# Patient Record
Sex: Female | Born: 1980 | Race: Black or African American | Hispanic: No | Marital: Married | Smoking: Never smoker
Health system: Southern US, Community
[De-identification: ages and names within clinical notes are randomized; demographics above are authoritative.]

## PROBLEM LIST (undated history)

## (undated) DIAGNOSIS — K621 Rectal polyp: Secondary | ICD-10-CM

## (undated) HISTORY — DX: Rectal polyp: K62.1

---

## 2005-07-08 ENCOUNTER — Emergency Department (HOSPITAL_COMMUNITY): Admission: EM | Admit: 2005-07-08 | Discharge: 2005-07-08 | Payer: Self-pay | Admitting: Emergency Medicine

## 2005-07-31 ENCOUNTER — Encounter: Admission: RE | Admit: 2005-07-31 | Discharge: 2005-07-31 | Payer: Self-pay | Admitting: Specialist

## 2007-06-15 ENCOUNTER — Emergency Department (HOSPITAL_COMMUNITY): Admission: EM | Admit: 2007-06-15 | Discharge: 2007-06-15 | Payer: Self-pay | Admitting: Emergency Medicine

## 2009-11-09 ENCOUNTER — Emergency Department (HOSPITAL_COMMUNITY): Admission: EM | Admit: 2009-11-09 | Discharge: 2009-11-09 | Payer: Self-pay | Admitting: Emergency Medicine

## 2012-03-16 ENCOUNTER — Ambulatory Visit (INDEPENDENT_AMBULATORY_CARE_PROVIDER_SITE_OTHER): Payer: Managed Care, Other (non HMO) | Admitting: Emergency Medicine

## 2012-03-16 VITALS — BP 97/59 | HR 63 | Temp 98.4°F | Resp 16 | Ht 65.25 in | Wt 108.0 lb

## 2012-03-16 DIAGNOSIS — K621 Rectal polyp: Secondary | ICD-10-CM

## 2012-03-16 DIAGNOSIS — K625 Hemorrhage of anus and rectum: Secondary | ICD-10-CM

## 2012-03-16 MED ORDER — HYDROCORTISONE ACETATE 25 MG RE SUPP: 25.0000 mg | Freq: Two times a day (BID) | RECTAL | Status: AC

## 2012-03-16 MED ORDER — LIDOCAINE HCL 2 % EX GEL: CUTANEOUS | Status: AC | PRN

## 2012-03-16 NOTE — Patient Instructions (Signed)
Use Tucks pads (over the counter)

## 2012-03-16 NOTE — Progress Notes (Signed)
  Subjective:    Patient ID: Wendy Shaffer, female    DOB: 07-Dec-1980, 31 y.o.   MRN: [id number]  HPI patient states for the last 2 months she has had a prolapsing hemorrhoid. She says this bleeds off and on. She denies ever having children. She's had no trauma to this area.    Review of Systems she is in good health with no medical problems     Objective:   Physical Exam exam is limited to the anal area. There is a 1 cm prolapsing either polyp or internal hemorrhoid which is present at the anus. The end of the lesion appears to be excoriated and irritated. Xylocaine jelly was applied.        Assessment & Plan:

## 2012-04-03 ENCOUNTER — Encounter (INDEPENDENT_AMBULATORY_CARE_PROVIDER_SITE_OTHER): Payer: Self-pay | Admitting: Surgery

## 2012-04-03 ENCOUNTER — Ambulatory Visit (INDEPENDENT_AMBULATORY_CARE_PROVIDER_SITE_OTHER): Payer: Managed Care, Other (non HMO) | Admitting: Surgery

## 2012-04-03 VITALS — BP 112/78 | HR 70 | Temp 96.8°F | Resp 14 | Ht 65.0 in | Wt 112.1 lb

## 2012-04-03 DIAGNOSIS — K645 Perianal venous thrombosis: Secondary | ICD-10-CM

## 2012-04-03 NOTE — Progress Notes (Signed)
Subjective:     Patient ID: Wendy Shaffer, female   DOB: [birth year], 31 y.o.   MRN: [id number]  HPI  Wendy Shaffer  August 02, 1981 [id number]  Patient Care Team: Iona Hansen as PCP - General (Nurse Practitioner) Collene Gobble, MD as Attending Physician (Family Medicine)  This patient is a 31 y.o.female who presents today for surgical evaluation at the request of Dr. Cleta Alberts.   Reason for visit: Prolapsing rectal mass. Question of inflamed hemorrhoid.  Patient is a pleasant healthy female originally from [place]. A few months ago she noticed after a bowel movement some material pushing out. It was initially reducible. However it seemed to be more out all the time. Painful and uncomfortable to sit. Some bleeding. She saw her primary care physician. She was started on a suppository. Initially it did not seem to get better. An appointment was set up with [place].  She notes that the swelling is finally starting to go down. The mass is back to only intermittently prolapsing. This week is better as well. She comes today with a friend. No fevers or chills. Energy level good. Normally has a bowel movement 2 times a day. No prior anorectal problems. No history of rectal bleeding or discharge.  Patient Active Problem List  Diagnoses  . Hemorrhoid, right posterior, internal, thrombosed    Past Medical History  Diagnosis Date  . Rectal polyp     History reviewed. No pertinent past surgical history.  History   Social History  . Marital Status: Single    Spouse Name: N/A    Number of Children: N/A  . Years of Education: N/A   Occupational History  . Not on file.   Social History Main Topics  . Smoking status: Never Smoker   . Smokeless tobacco: Never Used  . Alcohol Use: No  . Drug Use: No  . Sexually Active: Not on file   Other Topics Concern  . Not on file   Social History Narrative  . No narrative on file    History reviewed. No pertinent family history.  Current Outpatient  Prescriptions  Medication Sig Dispense Refill  . Flaxseed-Eve Prim-Bilberry (TEARS AGAIN HYDRATE PO) Take by mouth daily.      Marland Kitchen lidocaine (XYLOCAINE JELLY) 2 % jelly Apply topically as needed.  30 mL  3     No Known Allergies  BP 112/78  Pulse 70  Temp(Src) 96.8 F (36 C) (Temporal)  Resp 14  Ht 5\' 5"  (1.651 m)  Wt 112 lb 2 oz (50.86 kg)  BMI 18.66 kg/m2  LMP 03/11/2012  No results found.   Review of Systems  Constitutional: Negative for fever, chills, diaphoresis, appetite change and fatigue.  HENT: Negative for ear pain, sore throat, trouble swallowing, neck pain and ear discharge.   Eyes: Negative for photophobia, discharge and visual disturbance.  Respiratory: Negative for cough, choking, chest tightness and shortness of breath.   Cardiovascular: Negative for chest pain and palpitations.       Patient walks 60 minutes for about 2 miles without difficulty.  No exertional chest/neck/shoulder/arm pain.   Gastrointestinal: Negative for nausea, vomiting, abdominal pain, diarrhea, constipation, anal bleeding and rectal pain.  Genitourinary: Negative for dysuria, frequency and difficulty urinating.  Musculoskeletal: Negative for myalgias and gait problem.  Skin: Negative for color change, pallor and rash.  Neurological: Negative for dizziness, speech difficulty, weakness and numbness.  Hematological: Negative for adenopathy.  Psychiatric/Behavioral: Negative for confusion and agitation. The patient is not nervous/anxious.  Objective:   Physical Exam  Constitutional: She is oriented to person, place, and time. She appears well-developed and well-nourished. No distress.  HENT:  Head: Normocephalic.  Mouth/Throat: Oropharynx is clear and moist. No oropharyngeal exudate.  Eyes: Conjunctivae and EOM are normal. Pupils are equal, round, and reactive to light. No scleral icterus.  Neck: Normal range of motion. Neck supple. No tracheal deviation present.  Cardiovascular:  Normal rate, regular rhythm and intact distal pulses.   Pulmonary/Chest: Effort normal and breath sounds normal. No respiratory distress. She exhibits no tenderness.  Abdominal: Soft. She exhibits no distension and no mass. There is no tenderness. Hernia confirmed negative in the right inguinal area and confirmed negative in the left inguinal area.  Genitourinary: No vaginal discharge found.       Exam done with assistance of female Medical Assistant in the room.  Perianal skin clean with good hygiene.  No pruritis.  No external skin tags / hemorrhoids of significance.  No pilonidal disease.  No fissure.  No abscess/fistula.    Tolerates digital and anoscopic rectal exam.  Normal sphincter tone.  No rectal masses.    Enlarged R posterior hemorrhoid with clot.  INternal.  Does not prolapse w Valsalva.  Rest of hemorrhoidal piles WNL   Musculoskeletal: Normal range of motion. She exhibits no tenderness.  Lymphadenopathy:    She has no cervical adenopathy.       Right: No inguinal adenopathy present.       Left: No inguinal adenopathy present.  Neurological: She is alert and oriented to person, place, and time. No cranial nerve deficit. She exhibits normal muscle tone. Coordination normal.  Skin: Skin is warm and dry. No rash noted. She is not diaphoretic. No erythema.  Psychiatric: She has a normal mood and affect. Her behavior is normal. Judgment and thought content normal.       Assessment:     Right posterior inflamed hemorrhoid. Thrombosed. Resolving slowly.    Plan:     At this point, it seems to be gradually resolving. It did not prolapse from E. on exam today. I suspect he'll continue improve over the next month.  Another option is to consider a local block and unroof/excise the thrombosed hemorrhoid. However, because it's a few weeks out now and she's feeling better, I think it is reasonable to allow to follow the course as long as he continues to improve. She was leaning more  towards that. I noted that the bleeding does not go away, worsens, or if she has other issues; then, I would do unroofing of the thrombosed hemorrhoid. She and her friend seem reassured. They can followup when necessary.  The anatomy & physiology of the anorectal region was discussed.  The pathophysiology of hemorrhoids and differential diagnosis was discussed.  Natural history progression  was discussed.   I stressed the importance of a bowel regimen to have daily soft bowel movements to minimize progression of disease.   Goal of one BM / day ideal.  Use of wet wipes, warm baths, avoiding straining, etc were emphasized.  Educational handouts further explaining the pathology, treatment options, and bowel regimen were given as well.   The patient expressed understanding.

## 2012-04-03 NOTE — Patient Instructions (Signed)

## 2014-10-06 ENCOUNTER — Ambulatory Visit (INDEPENDENT_AMBULATORY_CARE_PROVIDER_SITE_OTHER): Payer: BC Managed Care – PPO | Admitting: Family Medicine

## 2014-10-06 VITALS — BP 118/60 | HR 93 | Temp 97.7°F | Resp 16 | Ht 65.5 in | Wt 112.8 lb

## 2014-10-06 DIAGNOSIS — Z3200 Encounter for pregnancy test, result unknown: Secondary | ICD-10-CM

## 2014-10-06 DIAGNOSIS — Z3201 Encounter for pregnancy test, result positive: Secondary | ICD-10-CM

## 2014-10-06 LAB — POCT URINE PREGNANCY: Preg Test, Ur: POSITIVE

## 2014-10-06 NOTE — Patient Instructions (Signed)
Congratulations on your pregnancy!   You are 9 weeks 2 days today Your estimated due date is 05/09/2015.   Take a pre- natal vitamin daily

## 2014-10-06 NOTE — Progress Notes (Signed)
[facility] [address], [place] [id number] [phone]  Date:  10/06/2014   Name:  Wendy OctaveHiwot Shaffer   DOB:  [birth year]   MRN:  [id number]  PCP:  Iona HansenJones, Penny L, NP    Chief Complaint: Possible Pregnancy   History of Present Illness:  Wendy Shaffer is a 33 y.o. very pleasant female patient who presents with the following:  She is here today for a pregnancy test.  She took a home test that was positive today.   LMP was 08/02/14.  This is her first pregnancy and she is quite pleased.   She is feeling well.  No vomiting, no pain or bleeding.    She does have an OBG already.  She needs her results faxed to social services today for some reason and has a fax number for me Advised to start taking PNV.  She does not drink alcohol or use drugs   Patient Active Problem List   Diagnosis Date Noted  . Hemorrhoid, right posterior, internal, thrombosed 04/03/2012    Past Medical History  Diagnosis Date  . Rectal polyp     History reviewed. No pertinent past surgical history.  History  Substance Use Topics  . Smoking status: Never Smoker   . Smokeless tobacco: Never Used  . Alcohol Use: No    History reviewed. No pertinent family history.  No Known Allergies  Medication list has been reviewed and updated.  Current Outpatient Prescriptions on File Prior to Visit  Medication Sig Dispense Refill  . Flaxseed-Eve Prim-Bilberry (TEARS AGAIN HYDRATE PO) Take by mouth daily.     No current facility-administered medications on file prior to visit.    Review of Systems:  As per HPI- otherwise negative.   Physical Examination: Filed Vitals:   10/06/14 1216  BP: 120/52  Pulse: 93  Temp: 97.7 F (36.5 C)  Resp: 16   Filed Vitals:   10/06/14 1216  Height: 5' 5.5" (1.664 m)  Weight: 112 lb 12.8 oz (51.166 kg)   Body mass index is 18.48 kg/(m^2). Ideal Body Weight: Weight in (lb) to have BMI = 25: 152.2  GEN: WDWN, NAD, Non-toxic, A & O x 3, looks well,  slim build HEENT: Atraumatic, Normocephalic. Neck supple. No masses, No LAD. Ears and Nose: No external deformity. CV: RRR, No M/G/R. No JVD. No thrill. No extra heart sounds. PULM: CTA B, no wheezes, crackles, rhonchi. No retractions. No resp. distress. No accessory muscle use. ABD: S, NT, ND. No rebound. No HSM. EXTR: No c/c/e NEURO Normal gait.  PSYCH: Normally interactive. Conversant. Not depressed or anxious appearing.  Calm demeanor.    Results for orders placed or performed in visit on 10/06/14  POCT urine pregnancy  Result Value Ref Range   Preg Test, Ur Positive      Assessment and Plan: Possible pregnancy - Plan: POCT urine pregnancy pregnancy test positive today, she is 9+2 by LMP.  She has no questions and has plans to start pre- natal care.  She will avoid any activities where she could be injured, take PNV, avoid alcohol, tobacco and drugs   Signed Abbe AmsterdamJessica Catalea Labrecque, MD She needs her HCG faxed to social services at [phone]

## 2014-11-13 NOTE — L&D Delivery Note (Signed)
Delivery Note Patient pushed for one hour after she was noted to be C/C/+1.  At 12:15 PM a viable and healthy female was delivered via Vaginal, Spontaneous Delivery (Presentation: Right Occiput ).  APGAR: 9, 9; weight 8 lb 12.2 oz (3975 g).   Placenta status: Intact, Spontaneous.  Cord: 3 vessels with the following complications: None.    Anesthesia: Pudendal  Episiotomy: None Lacerations: 2nd degree;Perineal Suture Repair: 2.0 3.0 chromic vicryl Est. Blood Loss (mL): 365  Mom to postpartum.  Baby to Couplet care / Skin to Skin.  Essie Hart STACIA 05/06/2015, 5:58 PM

## 2014-12-31 LAB — OB RESULTS CONSOLE ANTIBODY SCREEN: Antibody Screen: NEGATIVE

## 2014-12-31 LAB — OB RESULTS CONSOLE HIV ANTIBODY (ROUTINE TESTING): HIV: NONREACTIVE

## 2014-12-31 LAB — OB RESULTS CONSOLE RPR: RPR: NONREACTIVE

## 2014-12-31 LAB — OB RESULTS CONSOLE RUBELLA ANTIBODY, IGM: Rubella: IMMUNE

## 2014-12-31 LAB — OB RESULTS CONSOLE ABO/RH: RH Type: POSITIVE

## 2014-12-31 LAB — OB RESULTS CONSOLE GC/CHLAMYDIA
Chlamydia: NEGATIVE
Gonorrhea: NEGATIVE

## 2014-12-31 LAB — OB RESULTS CONSOLE HEPATITIS B SURFACE ANTIGEN: HEP B S AG: NEGATIVE

## 2015-04-09 LAB — OB RESULTS CONSOLE GBS: GBS: NEGATIVE

## 2015-05-06 ENCOUNTER — Encounter (HOSPITAL_COMMUNITY): Payer: Self-pay

## 2015-05-06 ENCOUNTER — Inpatient Hospital Stay (HOSPITAL_COMMUNITY)
Admission: AD | Admit: 2015-05-06 | Discharge: 2015-05-08 | DRG: 775 | Disposition: A | Discharge status: Home / Self Care | Payer: Medicaid Other | Source: Ambulatory Visit | Attending: Obstetrics & Gynecology | Admitting: Obstetrics & Gynecology

## 2015-05-06 DIAGNOSIS — Z3A39 39 weeks gestation of pregnancy: Secondary | ICD-10-CM | POA: Diagnosis present

## 2015-05-06 DIAGNOSIS — D649 Anemia, unspecified: Secondary | ICD-10-CM | POA: Diagnosis not present

## 2015-05-06 DIAGNOSIS — O9989 Other specified diseases and conditions complicating pregnancy, childbirth and the puerperium: Secondary | ICD-10-CM | POA: Diagnosis present

## 2015-05-06 DIAGNOSIS — O9081 Anemia of the puerperium: Secondary | ICD-10-CM | POA: Diagnosis not present

## 2015-05-06 LAB — CBC
HEMATOCRIT: 33.5 % — AB (ref 36.0–46.0)
Hemoglobin: 11.7 g/dL — ABNORMAL LOW (ref 12.0–15.0)
MCH: 33.1 pg (ref 26.0–34.0)
MCHC: 34.9 g/dL (ref 30.0–36.0)
MCV: 94.9 fL (ref 78.0–100.0)
PLATELETS: 237 10*3/uL (ref 150–400)
RBC: 3.53 MIL/uL — AB (ref 3.87–5.11)
RDW: 13.3 % (ref 11.5–15.5)
WBC: 12.2 10*3/uL — AB (ref 4.0–10.5)

## 2015-05-06 LAB — ABO/RH: ABO/RH(D): O POS

## 2015-05-06 LAB — TYPE AND SCREEN
ABO/RH(D): O POS
Antibody Screen: NEGATIVE

## 2015-05-06 MED ORDER — ACETAMINOPHEN 325 MG PO TABS: 650.0000 mg | ORAL_TABLET | ORAL | Status: DC | PRN

## 2015-05-06 MED ORDER — LIDOCAINE HCL (PF) 1 % IJ SOLN
30.0000 mL | INTRAMUSCULAR | Status: AC | PRN
  Administered 2015-05-06 (×2): 30 mL via SUBCUTANEOUS
  Filled 2015-05-06 (×2): qty 30

## 2015-05-06 MED ORDER — FLEET ENEMA 7-19 GM/118ML RE ENEM: 1.0000 | ENEMA | RECTAL | Status: DC | PRN

## 2015-05-06 MED ORDER — DIBUCAINE 1 % RE OINT: 1.0000 "application " | TOPICAL_OINTMENT | RECTAL | Status: DC | PRN

## 2015-05-06 MED ORDER — OXYTOCIN 40 UNITS IN LACTATED RINGERS INFUSION - SIMPLE MED: 62.5000 mL/h | INTRAVENOUS | Status: DC | PRN

## 2015-05-06 MED ORDER — DIPHENHYDRAMINE HCL 25 MG PO CAPS: 25.0000 mg | ORAL_CAPSULE | Freq: Four times a day (QID) | ORAL | Status: DC | PRN

## 2015-05-06 MED ORDER — LACTATED RINGERS IV SOLN: 500.0000 mL | INTRAVENOUS | Status: DC | PRN

## 2015-05-06 MED ORDER — WITCH HAZEL-GLYCERIN EX PADS: 1.0000 "application " | MEDICATED_PAD | CUTANEOUS | Status: DC | PRN

## 2015-05-06 MED ORDER — SENNOSIDES-DOCUSATE SODIUM 8.6-50 MG PO TABS
2.0000 | ORAL_TABLET | ORAL | Status: DC
  Administered 2015-05-07 – 2015-05-08 (×2): 2 via ORAL
  Filled 2015-05-06 (×2): qty 2

## 2015-05-06 MED ORDER — TETANUS-DIPHTH-ACELL PERTUSSIS 5-2.5-18.5 LF-MCG/0.5 IM SUSP: 0.5000 mL | Freq: Once | INTRAMUSCULAR | Status: DC

## 2015-05-06 MED ORDER — ACETAMINOPHEN 325 MG PO TABS
650.0000 mg | ORAL_TABLET | ORAL | Status: DC | PRN
  Administered 2015-05-07 – 2015-05-08 (×2): 650 mg via ORAL
  Filled 2015-05-06 (×2): qty 2

## 2015-05-06 MED ORDER — OXYTOCIN 40 UNITS IN LACTATED RINGERS INFUSION - SIMPLE MED
62.5000 mL/h | INTRAVENOUS | Status: DC
  Filled 2015-05-06: qty 1000

## 2015-05-06 MED ORDER — OXYTOCIN 40 UNITS IN LACTATED RINGERS INFUSION - SIMPLE MED
1.0000 m[IU]/min | INTRAVENOUS | Status: DC
Start: 2015-05-06 — End: 2015-05-07
  Administered 2015-05-06: 2 m[IU]/min via INTRAVENOUS
  Filled 2015-05-06: qty 1000

## 2015-05-06 MED ORDER — IBUPROFEN 600 MG PO TABS
600.0000 mg | ORAL_TABLET | Freq: Four times a day (QID) | ORAL | Status: DC
  Administered 2015-05-06 – 2015-05-08 (×8): 600 mg via ORAL
  Filled 2015-05-06 (×7): qty 1

## 2015-05-06 MED ORDER — OXYTOCIN BOLUS FROM INFUSION: 500.0000 mL | INTRAVENOUS | Status: DC

## 2015-05-06 MED ORDER — ONDANSETRON HCL 4 MG/2ML IJ SOLN: 4.0000 mg | INTRAMUSCULAR | Status: DC | PRN

## 2015-05-06 MED ORDER — OXYCODONE-ACETAMINOPHEN 5-325 MG PO TABS: 2.0000 | ORAL_TABLET | ORAL | Status: DC | PRN

## 2015-05-06 MED ORDER — PRENATAL MULTIVITAMIN CH
1.0000 | ORAL_TABLET | Freq: Every day | ORAL | Status: DC
  Administered 2015-05-06 – 2015-05-08 (×3): 1 via ORAL
  Filled 2015-05-06 (×3): qty 1

## 2015-05-06 MED ORDER — LANOLIN HYDROUS EX OINT: TOPICAL_OINTMENT | CUTANEOUS | Status: DC | PRN

## 2015-05-06 MED ORDER — ONDANSETRON HCL 4 MG/2ML IJ SOLN: 4.0000 mg | Freq: Four times a day (QID) | INTRAMUSCULAR | Status: DC | PRN

## 2015-05-06 MED ORDER — TERBUTALINE SULFATE 1 MG/ML IJ SOLN
0.2500 mg | Freq: Once | INTRAMUSCULAR | Status: AC | PRN
  Filled 2015-05-06: qty 1

## 2015-05-06 MED ORDER — SIMETHICONE 80 MG PO CHEW: 80.0000 mg | CHEWABLE_TABLET | ORAL | Status: DC | PRN

## 2015-05-06 MED ORDER — OXYCODONE-ACETAMINOPHEN 5-325 MG PO TABS: 1.0000 | ORAL_TABLET | ORAL | Status: DC | PRN

## 2015-05-06 MED ORDER — ONDANSETRON HCL 4 MG PO TABS: 4.0000 mg | ORAL_TABLET | ORAL | Status: DC | PRN

## 2015-05-06 MED ORDER — BACITRACIN ZINC 500 UNIT/GM EX OINT
TOPICAL_OINTMENT | CUTANEOUS | Status: DC | PRN
  Filled 2015-05-06: qty 28.35

## 2015-05-06 MED ORDER — CITRIC ACID-SODIUM CITRATE 334-500 MG/5ML PO SOLN: 30.0000 mL | ORAL | Status: DC | PRN

## 2015-05-06 MED ORDER — ZOLPIDEM TARTRATE 5 MG PO TABS: 5.0000 mg | ORAL_TABLET | Freq: Every evening | ORAL | Status: DC | PRN

## 2015-05-06 MED ORDER — BUTORPHANOL TARTRATE 1 MG/ML IJ SOLN
INTRAMUSCULAR | Status: AC
  Administered 2015-05-06: 1 mg via INTRAVENOUS
  Filled 2015-05-06: qty 1

## 2015-05-06 MED ORDER — LACTATED RINGERS IV SOLN
INTRAVENOUS | Status: DC
  Administered 2015-05-06: 07:00:00 via INTRAVENOUS

## 2015-05-06 MED ORDER — BUTORPHANOL TARTRATE 1 MG/ML IJ SOLN
1.0000 mg | INTRAMUSCULAR | Status: DC | PRN
  Administered 2015-05-06: 1 mg via INTRAVENOUS

## 2015-05-06 MED ORDER — BENZOCAINE-MENTHOL 20-0.5 % EX AERO
1.0000 "application " | INHALATION_SPRAY | CUTANEOUS | Status: DC | PRN
  Administered 2015-05-06: 1 via TOPICAL
  Filled 2015-05-06: qty 56

## 2015-05-06 NOTE — H&P (Signed)
Wendy Shaffer is a 34 y.o. female presenting for rupture of membranes, clear at 05:45, irregular ctx, no vaginal bleeding.  Maternal Medical History:  Reason for admission: Rupture of membranes.  Nausea.  Contractions: Onset was 3-5 hours ago.   Frequency: irregular.   Duration is approximately 60 seconds.   Perceived severity is mild.    Fetal activity: Perceived fetal activity is normal.   Last perceived fetal movement was within the past 12 hours.    Prenatal complications: no prenatal complications Prenatal Complications - Diabetes: none.    OB History    Gravida Para Term Preterm AB TAB SAB Ectopic Multiple Living   1              Past Medical History  Diagnosis Date  . Rectal polyp    History reviewed. No pertinent past surgical history. Family History: family history is not on file. Social History:  reports that she has never smoked. She has never used smokeless tobacco. She reports that she does not drink alcohol or use illicit drugs.   Prenatal Transfer Tool  Maternal Diabetes: No Genetic Screening: Declined Maternal Ultrasounds/Referrals: Normal Fetal Ultrasounds or other Referrals:  Other: anatomy scan normal Maternal Substance Abuse:  No Significant Maternal Medications:  None Significant Maternal Lab Results:  Lab values include: Group B Strep negative Other Comments:  None  Review of Systems  Constitutional: Negative for fever and chills.  HENT: Negative for hearing loss.   Eyes: Negative for blurred vision and double vision.  Respiratory: Negative for cough and hemoptysis.   Cardiovascular: Negative for chest pain and palpitations.  Gastrointestinal: Negative for heartburn and nausea.  Genitourinary: Negative for dysuria and urgency.  Musculoskeletal: Negative for myalgias and neck pain.  Skin: Negative for rash.  Neurological: Negative for dizziness and headaches.  Endo/Heme/Allergies: Does not bruise/bleed easily.  Psychiatric/Behavioral: Negative  for depression and suicidal ideas.  All other systems reviewed and are negative.   Dilation: 3.5 Effacement (%): 90 Station: -1 Exam by:: Sowder,RNC Blood pressure 110/71, pulse 82, temperature 98.7 F (37.1 C), temperature source Oral, resp. rate 20, height 5\' 5"  (1.651 m), weight 72.122 kg (159 lb), last menstrual period 08/02/2014. Maternal Exam:  Uterine Assessment: Contraction strength is mild.  Contraction duration is 60 seconds. Contraction frequency is regular.   Abdomen: Patient reports no abdominal tenderness. Fundal height is 39 cm.   Estimated fetal weight is 3000 grams.   Fetal presentation: vertex  Introitus: Normal vulva. Normal vagina.  Ferning test: positive.  Nitrazine test: positive. Amniotic fluid character: clear.  Pelvis: adequate for delivery.   Cervix: Cervix evaluated by digital exam.   3/80/-2  Fetal Exam Fetal Monitor Review: Baseline rate: 135.  Variability: moderate (6-25 bpm).   Pattern: accelerations present and no decelerations.    Fetal State Assessment: Category I - tracings are normal.     Physical Exam  Nursing note and vitals reviewed. Constitutional: She is oriented to person, place, and time. She appears well-developed and well-nourished.  HENT:  Head: Normocephalic and atraumatic.  Eyes: Pupils are equal, round, and reactive to light.  Neck: Normal range of motion.  Cardiovascular: Normal rate and normal heart sounds.   GI: Soft. Bowel sounds are normal.  Musculoskeletal: Normal range of motion.  Neurological: She is alert and oriented to person, place, and time. She has normal reflexes.    Prenatal labs: ABO, Rh: --/--/O POS (06/23 0654) Antibody: NEG (06/23 0654) Rubella: Immune (02/18 0000) RPR: Nonreactive (02/18 0000)  HBsAg: Negative (  02/18 0000)  HIV: Non-reactive (02/18 0000)  GBS: Negative (05/27 0000)   Assessment/Plan: 34 yo G1P0 at 39 weeks 4 days s/p SROM in early labor Admit to L&D Continuous  monitoring Epidural on demand Pitocin if needed for augmentation    Abisai Deer STACIA 05/06/2015, 9:57 AM

## 2015-05-06 NOTE — MAU Note (Signed)
Pt states that she SROM at 0545 for clear fluid. Pt denies bleeding and states that baby is acive. Pt is not having contractions that are strong.

## 2015-05-06 NOTE — Progress Notes (Signed)
Pts temp has been rising since admission and is now 100.4 with pulse 101 and BP 120/74 at 1820.  Pts fundus is high at 2/U.  Got pt up to bathroom and emptied bladder.No dizziness or light headedness noted.  Pt did complain of some pain and pressure in the perineum.  Passed a ping pong sized clot.  Got pt back to bed and fundus is firm midline but still to pts right.  As massaging fundus looks like more clots trying to come out but nothing does.  Dr. Mora Appl called at 1840 and was reported all these findings.  No orders received and told to continue to monitor.

## 2015-05-07 LAB — CBC
HCT: 25.8 % — ABNORMAL LOW (ref 36.0–46.0)
HEMOGLOBIN: 9 g/dL — AB (ref 12.0–15.0)
MCH: 33.1 pg (ref 26.0–34.0)
MCHC: 34.9 g/dL (ref 30.0–36.0)
MCV: 94.9 fL (ref 78.0–100.0)
Platelets: 180 10*3/uL (ref 150–400)
RBC: 2.72 MIL/uL — AB (ref 3.87–5.11)
RDW: 13.3 % (ref 11.5–15.5)
WBC: 15.2 10*3/uL — AB (ref 4.0–10.5)

## 2015-05-07 LAB — RPR: RPR Ser Ql: NONREACTIVE

## 2015-05-07 MED ORDER — FERROUS SULFATE 325 (65 FE) MG PO TABS
325.0000 mg | ORAL_TABLET | Freq: Every day | ORAL | Status: DC
  Administered 2015-05-08: 325 mg via ORAL
  Filled 2015-05-07: qty 1

## 2015-05-07 NOTE — Progress Notes (Signed)
Patient is eating, ambulating, voiding.  Pain control is good.  Appropriate lochia.  No complaints.  Filed Vitals:   05/06/15 1820 05/06/15 1957 05/07/15 0543 05/07/15 1713  BP: 120/74 104/65 109/57 105/62  Pulse: 101 86 76 100  Temp: 100.4 F (38 C) 99.1 F (37.3 C) 98.8 F (37.1 C) 98.3 F (36.8 C)  TempSrc: Oral Oral Oral Oral  Resp: 20 20 19 18   Height:      Weight:      SpO2:   100%     Fundus firm Perineum without swelling.  Lab Results  Component Value Date   WBC 15.2* 05/07/2015   HGB 9.0* 05/07/2015   HCT 25.8* 05/07/2015   MCV 94.9 05/07/2015   PLT 180 05/07/2015    --/--/O POS (06/23 0700)  A/P Post partum day 1. Anemia; FESO4 qd  Routine care.  Expect d/c 6/25.    Philip Aspen

## 2015-05-07 NOTE — Lactation Note (Signed)
This note was copied from the chart of Wendy Natalin Steidle. Lactation Consultation Note New mom BF in cradle position, w/not a deep latch. Baby not suckling on breast, just licking. Assisted in football position for a deeper latch.  Mom has tubular breast w/2 fingers width space between breast. Breast feel knotty. Large areola and large everted nipple w/short shaft. Encouraged to stimulate to evert more.  Mom speaks good [place] for communication.  Assisted in football position for deeper latch. Took baby a few minutes to start suckling. Beautiful baby Wendy BF well at breast. A lot of teaching to mom on positioning, encouraged not to pull back on breast while BF w/unlatch baby. Referred to Baby and Me Book in Breastfeeding section Pg. 22-23 for position options and Proper latch demonstration. Mom encouraged to feed baby 8-12 times/24 hours and with feeding cues. Mom encouraged to waken baby for feeds. Educated about newborn behavior. Mom encouraged to do skin-to-skin. WH/LC brochure given w/resources, support groups and LC services. Encouraged comfort during BF so colostrum flows better and mom will enjoy the feeding longer. Taking deep breaths and breast massage during BF. Hand expression taught w/glistening of colostrum.  Patient Name: Wendy Shaffer [id number] Date: 05/07/2015 Reason for consult: Initial assessment   Maternal Data    Feeding Feeding Type: Breast Fed Length of feed: 10 min (still BF)  LATCH Score/Interventions Latch: Repeated attempts needed to sustain latch, nipple held in mouth throughout feeding, stimulation needed to elicit sucking reflex. Intervention(s): Adjust position;Assist with latch;Breast massage;Breast compression  Audible Swallowing: A few with stimulation Intervention(s): Skin to skin;Hand expression;Alternate breast massage  Type of Nipple: Everted at rest and after stimulation  Comfort (Breast/Nipple): Soft / non-tender     Hold (Positioning): Assistance  needed to correctly position infant at breast and maintain latch. Intervention(s): Skin to skin;Position options;Support Pillows;Breastfeeding basics reviewed  LATCH Score: 7  Lactation Tools Discussed/Used     Consult Status Consult Status: Follow-up Date: 05/07/15 (in pm) Follow-up type: In-patient    Arnell Mausolf, Diamond Nickel 05/07/2015, 1:38 AM

## 2015-05-07 NOTE — Progress Notes (Signed)
UR chart review completed.  

## 2015-05-08 NOTE — Discharge Summary (Signed)
Obstetric Discharge Summary Reason for Admission: rupture of membranes Prenatal Procedures: ultrasound Intrapartum Procedures: spontaneous vaginal delivery Postpartum Procedures: none Complications-Operative and Postpartum: 2nd degree perineal laceration HEMOGLOBIN  Date Value Ref Range Status  05/07/2015 9.0* 12.0 - 15.0 g/dL Final    Comment:    DELTA CHECK NOTED REPEATED TO VERIFY    HCT  Date Value Ref Range Status  05/07/2015 25.8* 36.0 - 46.0 % Final    Physical Exam:  General: alert and cooperative Lochia: appropriate Uterine Fundus: firm DVT Evaluation: No evidence of DVT seen on physical exam.  Discharge Diagnoses: Term Pregnancy-delivered  Discharge Information: Date: 05/08/2015 Activity: pelvic rest Diet: routine Medications: PNV and Ibuprofen Condition: stable Instructions: refer to practice specific booklet Discharge to: home Follow-up Information    Follow up with [name], [name], MD In 4 weeks.   Specialty:  Obstetrics and Gynecology   Contact information:   [address] [place] [address] [phone]       Newborn Data: Live born female  Birth Weight: 8 lb 12.2 oz (3975 g) APGAR: 9, 9  Home with mother.  Wendy Shaffer 05/08/2015, 4:05 PM

## 2015-05-08 NOTE — Lactation Note (Signed)
This note was copied from the chart of Wendy Alexandrya Joos. Lactation Consultation Note: Mom reports that baby last fed at 1;30 am. Encouraged to undress and waken baby. She latched well with swallows noted. Reviewed feeding at least 8 times/24 hours. Reviewed our phone number to call with questions/concerns. No questions at present. To call prn  Patient Name: Wendy Shaffer [id number] Date: 05/08/2015 Reason for consult: Follow-up assessment   Maternal Data Formula Feeding for Exclusion: Yes Reason for exclusion: Mother's choice to formula and breast feed on admission Has patient been taught Hand Expression?: Yes Does the patient have breastfeeding experience prior to this delivery?: No  Feeding Feeding Type: Breast Fed Length of feed: 0 min  LATCH Score/Interventions Latch: Grasps breast easily, tongue down, lips flanged, rhythmical sucking.  Audible Swallowing: A few with stimulation  Type of Nipple: Everted at rest and after stimulation  Comfort (Breast/Nipple): Soft / non-tender     Hold (Positioning): Assistance needed to correctly position infant at breast and maintain latch. Intervention(s): Breastfeeding basics reviewed;Support Pillows  LATCH Score: 8  Lactation Tools Discussed/Used     Consult Status Consult Status: Complete    Pamelia Hoit 05/08/2015, 8:42 AM

## 2015-07-20 ENCOUNTER — Encounter: Payer: BLUE CROSS/BLUE SHIELD | Admitting: Podiatry

## 2015-07-20 NOTE — Progress Notes (Signed)
This encounter was created in error - please disregard.

## 2015-12-14 ENCOUNTER — Ambulatory Visit: Payer: BLUE CROSS/BLUE SHIELD | Admitting: Podiatry

## 2017-11-13 NOTE — L&D Delivery Note (Signed)
Pt presented in active labor. She pushed briefly and had a SVD of one live viable infant in the ROA position over a second degree midline tear. Placenta S/I. EBL-400cc. Baby to NBN. Tear closed with 30 chromic. A small clitoral tear was also closed.

## 2017-12-19 LAB — OB RESULTS CONSOLE ABO/RH: RH Type: POSITIVE

## 2017-12-19 LAB — OB RESULTS CONSOLE HEPATITIS B SURFACE ANTIGEN: Hepatitis B Surface Ag: NEGATIVE

## 2017-12-19 LAB — OB RESULTS CONSOLE ANTIBODY SCREEN: Antibody Screen: NEGATIVE

## 2017-12-19 LAB — OB RESULTS CONSOLE RUBELLA ANTIBODY, IGM: RUBELLA: IMMUNE

## 2017-12-19 LAB — OB RESULTS CONSOLE HIV ANTIBODY (ROUTINE TESTING): HIV: NONREACTIVE

## 2017-12-19 LAB — OB RESULTS CONSOLE GC/CHLAMYDIA
Chlamydia: NEGATIVE
Gonorrhea: NEGATIVE

## 2017-12-19 LAB — OB RESULTS CONSOLE RPR: RPR: NONREACTIVE

## 2018-05-03 LAB — OB RESULTS CONSOLE GBS: GBS: NEGATIVE

## 2018-05-20 ENCOUNTER — Other Ambulatory Visit: Payer: Self-pay | Admitting: Obstetrics and Gynecology

## 2018-05-20 ENCOUNTER — Telehealth (HOSPITAL_COMMUNITY): Payer: Self-pay | Admitting: *Deleted

## 2018-05-20 ENCOUNTER — Encounter (HOSPITAL_COMMUNITY): Payer: Self-pay | Admitting: *Deleted

## 2018-05-21 NOTE — Telephone Encounter (Signed)
Preadmission screen  

## 2018-05-28 ENCOUNTER — Inpatient Hospital Stay (HOSPITAL_COMMUNITY): Admission: RE | Admit: 2018-05-28 | Payer: Medicaid Other | Source: Ambulatory Visit

## 2018-06-02 ENCOUNTER — Encounter (HOSPITAL_COMMUNITY): Payer: Self-pay | Admitting: General Practice

## 2018-06-02 ENCOUNTER — Inpatient Hospital Stay (HOSPITAL_COMMUNITY)
Admission: AD | Admit: 2018-06-02 | Discharge: 2018-06-04 | DRG: 806 | Disposition: A | Discharge status: Home / Self Care | Payer: Medicaid Other | Attending: Obstetrics and Gynecology | Admitting: Obstetrics and Gynecology

## 2018-06-02 DIAGNOSIS — D649 Anemia, unspecified: Secondary | ICD-10-CM | POA: Diagnosis present

## 2018-06-02 DIAGNOSIS — Z3490 Encounter for supervision of normal pregnancy, unspecified, unspecified trimester: Secondary | ICD-10-CM

## 2018-06-02 DIAGNOSIS — O9902 Anemia complicating childbirth: Principal | ICD-10-CM | POA: Diagnosis present

## 2018-06-02 DIAGNOSIS — O2243 Hemorrhoids in pregnancy, third trimester: Secondary | ICD-10-CM | POA: Diagnosis present

## 2018-06-02 DIAGNOSIS — O09529 Supervision of elderly multigravida, unspecified trimester: Secondary | ICD-10-CM

## 2018-06-02 DIAGNOSIS — Z3A38 38 weeks gestation of pregnancy: Secondary | ICD-10-CM

## 2018-06-02 DIAGNOSIS — O09523 Supervision of elderly multigravida, third trimester: Secondary | ICD-10-CM

## 2018-06-02 DIAGNOSIS — Z3A39 39 weeks gestation of pregnancy: Secondary | ICD-10-CM

## 2018-06-02 DIAGNOSIS — Z3483 Encounter for supervision of other normal pregnancy, third trimester: Secondary | ICD-10-CM | POA: Diagnosis present

## 2018-06-02 LAB — RPR: RPR Ser Ql: NONREACTIVE

## 2018-06-02 LAB — CBC
HCT: 33.4 % — ABNORMAL LOW (ref 36.0–46.0)
Hemoglobin: 11.2 g/dL — ABNORMAL LOW (ref 12.0–15.0)
MCH: 29.9 pg (ref 26.0–34.0)
MCHC: 33.5 g/dL (ref 30.0–36.0)
MCV: 89.1 fL (ref 78.0–100.0)
PLATELETS: 223 10*3/uL (ref 150–400)
RBC: 3.75 MIL/uL — ABNORMAL LOW (ref 3.87–5.11)
RDW: 14.8 % (ref 11.5–15.5)
WBC: 12.5 10*3/uL — ABNORMAL HIGH (ref 4.0–10.5)

## 2018-06-02 LAB — TYPE AND SCREEN
ABO/RH(D): O POS
Antibody Screen: NEGATIVE

## 2018-06-02 MED ORDER — DIBUCAINE 1 % RE OINT: 1.0000 "application " | TOPICAL_OINTMENT | RECTAL | Status: DC | PRN

## 2018-06-02 MED ORDER — BENZOCAINE-MENTHOL 20-0.5 % EX AERO
1.0000 "application " | INHALATION_SPRAY | CUTANEOUS | Status: DC | PRN
  Administered 2018-06-02 – 2018-06-04 (×2): 1 via TOPICAL
  Filled 2018-06-02 (×2): qty 56

## 2018-06-02 MED ORDER — LACTATED RINGERS IV SOLN: 500.0000 mL | INTRAVENOUS | Status: DC | PRN

## 2018-06-02 MED ORDER — WITCH HAZEL-GLYCERIN EX PADS: 1.0000 "application " | MEDICATED_PAD | CUTANEOUS | Status: DC | PRN

## 2018-06-02 MED ORDER — OXYTOCIN 40 UNITS IN LACTATED RINGERS INFUSION - SIMPLE MED
2.5000 [IU]/h | INTRAVENOUS | Status: DC
  Administered 2018-06-02: 2.5 [IU]/h via INTRAVENOUS

## 2018-06-02 MED ORDER — IBUPROFEN 600 MG PO TABS
600.0000 mg | ORAL_TABLET | Freq: Four times a day (QID) | ORAL | Status: DC
  Administered 2018-06-02 – 2018-06-04 (×7): 600 mg via ORAL
  Filled 2018-06-02 (×9): qty 1

## 2018-06-02 MED ORDER — SIMETHICONE 80 MG PO CHEW: 80.0000 mg | CHEWABLE_TABLET | ORAL | Status: DC | PRN

## 2018-06-02 MED ORDER — ONDANSETRON HCL 4 MG/2ML IJ SOLN: 4.0000 mg | Freq: Four times a day (QID) | INTRAMUSCULAR | Status: DC | PRN

## 2018-06-02 MED ORDER — ONDANSETRON HCL 4 MG PO TABS: 4.0000 mg | ORAL_TABLET | ORAL | Status: DC | PRN

## 2018-06-02 MED ORDER — OXYTOCIN 10 UNIT/ML IJ SOLN
INTRAMUSCULAR | Status: AC
  Filled 2018-06-02: qty 1

## 2018-06-02 MED ORDER — ONDANSETRON HCL 4 MG/2ML IJ SOLN: 4.0000 mg | INTRAMUSCULAR | Status: DC | PRN

## 2018-06-02 MED ORDER — LACTATED RINGERS IV SOLN
INTRAVENOUS | Status: DC
  Administered 2018-06-02: 05:00:00 via INTRAVENOUS

## 2018-06-02 MED ORDER — OXYTOCIN 40 UNITS IN LACTATED RINGERS INFUSION - SIMPLE MED
1.0000 m[IU]/min | INTRAVENOUS | Status: DC
  Filled 2018-06-02: qty 1000

## 2018-06-02 MED ORDER — OXYTOCIN BOLUS FROM INFUSION
500.0000 mL | Freq: Once | INTRAVENOUS | Status: AC
  Administered 2018-06-02: 500 mL via INTRAVENOUS

## 2018-06-02 MED ORDER — LIDOCAINE HCL (PF) 1 % IJ SOLN
30.0000 mL | INTRAMUSCULAR | Status: DC | PRN
  Administered 2018-06-02: 30 mL via SUBCUTANEOUS
  Filled 2018-06-02: qty 30

## 2018-06-02 MED ORDER — ACETAMINOPHEN 325 MG PO TABS: 650.0000 mg | ORAL_TABLET | ORAL | Status: DC | PRN

## 2018-06-02 MED ORDER — TETANUS-DIPHTH-ACELL PERTUSSIS 5-2.5-18.5 LF-MCG/0.5 IM SUSP: 0.5000 mL | Freq: Once | INTRAMUSCULAR | Status: DC

## 2018-06-02 MED ORDER — SENNOSIDES-DOCUSATE SODIUM 8.6-50 MG PO TABS
2.0000 | ORAL_TABLET | ORAL | Status: DC
  Administered 2018-06-02 – 2018-06-03 (×2): 2 via ORAL
  Filled 2018-06-02 (×2): qty 2

## 2018-06-02 MED ORDER — SOD CITRATE-CITRIC ACID 500-334 MG/5ML PO SOLN: 30.0000 mL | ORAL | Status: DC | PRN

## 2018-06-02 MED ORDER — ZOLPIDEM TARTRATE 5 MG PO TABS
5.0000 mg | ORAL_TABLET | Freq: Every evening | ORAL | Status: DC | PRN
Start: 2018-06-02 — End: 2018-06-04

## 2018-06-02 MED ORDER — MEASLES, MUMPS & RUBELLA VAC ~~LOC~~ INJ
0.5000 mL | INJECTION | Freq: Once | SUBCUTANEOUS | Status: DC
  Filled 2018-06-02: qty 0.5

## 2018-06-02 MED ORDER — COCONUT OIL OIL: 1.0000 "application " | TOPICAL_OIL | Status: DC | PRN

## 2018-06-02 MED ORDER — LIDOCAINE HCL (PF) 1 % IJ SOLN
INTRAMUSCULAR | Status: AC
  Administered 2018-06-02: 30 mL
  Filled 2018-06-02: qty 30

## 2018-06-02 MED ORDER — TERBUTALINE SULFATE 1 MG/ML IJ SOLN
0.2500 mg | Freq: Once | INTRAMUSCULAR | Status: DC | PRN
  Filled 2018-06-02: qty 1

## 2018-06-02 NOTE — MAU Note (Signed)
Contractions since 0320

## 2018-06-02 NOTE — H&P (Signed)
Wendy Shaffer is an 37 y.o. G2P1001 [id number] black female who is admitted in labor. PNC was complicated by AMA and late pnc. She declined genetic testing. She is GBS neg.  Chief Complaint: HPI:  Past Medical History:  Diagnosis Date  . Rectal polyp     No past surgical history on file.  No family history on file. Social History:  reports that she has never smoked. She has never used smokeless tobacco. She reports that she does not drink alcohol or use drugs.  Allergies: No Known Allergies  Medications Prior to Admission  Medication Sig Dispense Refill  . Fish Oil-Cholecalciferol (FISH OIL + D3 PO) Take by mouth.    Judie Petit. Flaxseed-Eve Prim-Bilberry (TEARS AGAIN HYDRATE PO) Take by mouth daily.    . Prenatal MV-Min-Fe Fum-FA-DHA (CENTRUM SPECIALIST PRENATAL PO) Take 1 tablet by mouth daily.          Blood pressure 130/72, pulse 93, temperature 98.5 F (36.9 C), temperature source Oral, resp. rate 20, weight 152 lb (68.9 kg), last menstrual period 09/04/2017, unknown if currently breastfeeding. General appearance: alert, cooperative and mild distress Abdomen: gravid, non tender   Lab Results  Component Value Date   WBC 15.2 (H) 05/07/2015   HGB 9.0 (L) 05/07/2015   HCT 25.8 (L) 05/07/2015   MCV 94.9 05/07/2015   PLT 180 05/07/2015   Lab Results  Component Value Date   PREGTESTUR Positive 10/06/2014      Patient Active Problem List   Diagnosis Date Noted  . AMA (advanced maternal age) multigravida 35+ 06/02/2018  . Indication for care in labor or delivery 05/06/2015  . Normal vaginal delivery 05/06/2015  . Hemorrhoid, right posterior, internal, thrombosed 04/03/2012   IMP/ IUP at term in labor         AMA Plan/ Admit  Wendy Shaffer E 06/02/2018, 4:56 AM

## 2018-06-03 ENCOUNTER — Other Ambulatory Visit: Payer: Self-pay

## 2018-06-03 ENCOUNTER — Encounter (HOSPITAL_COMMUNITY): Payer: Self-pay | Admitting: *Deleted

## 2018-06-03 LAB — CBC
HEMATOCRIT: 26.5 % — AB (ref 36.0–46.0)
Hemoglobin: 9 g/dL — ABNORMAL LOW (ref 12.0–15.0)
MCH: 30.6 pg (ref 26.0–34.0)
MCHC: 34 g/dL (ref 30.0–36.0)
MCV: 90.1 fL (ref 78.0–100.0)
Platelets: 160 10*3/uL (ref 150–400)
RBC: 2.94 MIL/uL — ABNORMAL LOW (ref 3.87–5.11)
RDW: 15.2 % (ref 11.5–15.5)
WBC: 11.8 10*3/uL — ABNORMAL HIGH (ref 4.0–10.5)

## 2018-06-03 MED ORDER — FERROUS SULFATE 325 (65 FE) MG PO TABS
325.0000 mg | ORAL_TABLET | Freq: Every day | ORAL | Status: DC
  Administered 2018-06-04: 325 mg via ORAL
  Filled 2018-06-03: qty 1

## 2018-06-03 NOTE — Lactation Note (Signed)
This note was copied from a baby's chart. Lactation Consultation Note:   Mother is a P2 . She reports that she breastfed her first child for 10 months. Infant is 4333 hours old and has been under double photo .  Mother has been exclusively breastfeeding . Mother was offered to be sat up with a DEBP and post pump to supplement infant with any amt of ebm.  Mother reports she just wants to breastfeed infant only. Staff nurse brought mother a bottle of formula and a syringe to offer infant supplement. Mother reports that she doesn't want to use formula.   Mother was offered a harmony hand pump. Mother had a room full of visitors. She was given a # 27 flange to see best fit.  Discussed post pumping and offering infant ebm with the curved tip syringe. Mother reports that she will call for assistance as needed with using the curved tip syringe.   Advised mother to continue to breastfeed infant 8-12 times in 24 hours and cue base feeding. Mother advised to continue to hand express frequently.  Mother was given Lactation brochure with information on available LC services , BFSG and outpatient services.   Patient Name: Wendy Towanda OctaveHiwot Tung ZOXWR'UToday's Shaffer: 06/03/2018 Reason for consult: Initial assessment   Maternal Data Has patient been taught Hand Expression?: Yes Does the patient have breastfeeding experience prior to this delivery?: Yes  Feeding Feeding Type: Breast Fed  LATCH Score Latch: Grasps breast easily, tongue down, lips flanged, rhythmical sucking.  Audible Swallowing: Spontaneous and intermittent  Type of Nipple: Everted at rest and after stimulation  Comfort (Breast/Nipple): Soft / non-tender  Hold (Positioning): No assistance needed to correctly position infant at breast.  LATCH Score: 10  Interventions Interventions: Hand pump  Lactation Tools Discussed/Used     Consult Status Consult Status: Follow-up Shaffer: 06/04/18 Follow-up type: In-patient    Stevan BornKendrick, Jnai Snellgrove  [facility] 06/03/2018, 3:26 PM

## 2018-06-03 NOTE — Progress Notes (Signed)
Patient is eating, ambulating, voiding.  Pain control is good.  Appropriate lochia, no complaints.  Vitals:   06/02/18 1330 06/02/18 1800 06/02/18 2200 06/03/18 0545  BP: (!) 100/57 (!) 101/58 101/62 97/67  Pulse: 83 85 86 79  Resp: 18 18 20 18   Temp: 100.1 F (37.8 C) 98.8 F (37.1 C) 98.9 F (37.2 C) 98.5 F (36.9 C)  TempSrc:  Oral  Oral  SpO2: 99% 100%    Weight:      Height:        Fundus firm Ext: no calf tenderness  Lab Results  Component Value Date   WBC 11.8 (H) 06/03/2018   HGB 9.0 (L) 06/03/2018   HCT 26.5 (L) 06/03/2018   MCV 90.1 06/03/2018   PLT 160 06/03/2018    --/--/O POS (07/21 0445)  A/P Post partum day 1. Anemia- add Fe daily  Routine care.  Expect d/c 7/23.    Wendy Shaffer, Yehia Mcbain

## 2018-06-04 MED ORDER — OXYCODONE-ACETAMINOPHEN 5-325 MG PO TABS: 1.0000 | ORAL_TABLET | Freq: Four times a day (QID) | ORAL | 0 refills | Status: AC | PRN

## 2018-06-04 MED ORDER — DOCUSATE SODIUM 100 MG PO CAPS: 100.0000 mg | ORAL_CAPSULE | Freq: Two times a day (BID) | ORAL | 0 refills | Status: AC

## 2018-06-04 MED ORDER — IBUPROFEN 600 MG PO TABS: 600.0000 mg | ORAL_TABLET | Freq: Four times a day (QID) | ORAL | 0 refills | Status: DC | PRN

## 2018-06-04 NOTE — Lactation Note (Addendum)
This note was copied from a baby's chart. Lactation Consultation Note  Patient Name: Boy Towanda OctaveHiwot Eberlin ZOXWR'UToday's Date: 06/04/2018 Reason for consult: Follow-up assessment;Early term 37-38.6wks;Hyperbilirubinemia P2,  ETI infant , 50 hours old on bilii lights due to hyoerbirubinemia. Mom w/ previous BF experience  she BF older son 10 months. Per mom, BF baby 30 minutes prior LC entering room, infant in basinet on billi lights. Mom receptive to using DEBP. LC demonstrated how to use, clean and store parts. Mom was still pumping as LC left room and colostrum was being expressed in bottles. Mom knows to pump q3h for 15-20 min. LC talked w/ RN she will teach parents how to give EBM in foley cup or curve tip syringe. Mom plans to continue to BF w/ hunger cuing, 8-12 times with /24 hours. Will pump after feeding infant to breast and give back any EBM.    Parents will cal LC if have any questions or concerns. Maternal Data    Feeding Feeding Type: Breast Fed Length of feed: 25 min  LATCH Score                   Interventions Interventions: DEBP;Breast massage;Hand express  Lactation Tools Discussed/Used Pump Review: Setup, frequency, and cleaning Initiated by:: Danelle Earthlyobin Val Schiavo, IBCLC Date initiated:: 06/04/18   Consult Status Consult Status: Follow-up Date: 06/04/18 Follow-up type: In-patient    Danelle EarthlyRobin Zandria Woldt 06/04/2018, 7:18 AM

## 2018-06-04 NOTE — Discharge Summary (Signed)
Obstetric Discharge Summary Reason for Admission: onset of labor Prenatal Procedures: NST and ultrasound Intrapartum Procedures: spontaneous vaginal delivery Postpartum Procedures: none Complications-Operative and Postpartum: 2nd degree perineal laceration Hemoglobin  Date Value Ref Range Status  06/03/2018 9.0 (L) 12.0 - 15.0 g/dL Final   HCT  Date Value Ref Range Status  06/03/2018 26.5 (L) 36.0 - 46.0 % Final    Physical Exam:  General: alert, cooperative and appears stated age 19Lochia: appropriate Uterine Fundus: firm   Discharge Diagnoses: Term Pregnancy-delivered  Discharge Information: Date: 06/04/2018 Activity: pelvic rest Diet: routine Medications: Ibuprofen, Colace and Percocet Condition: improved Instructions: refer to practice specific booklet Discharge to: home Follow-up Information    Levi AlandAnderson, Mark E, MD Follow up in 4 week(s).   Specialty:  Obstetrics and Gynecology Why:  For a postpartum evaluation Contact information: [address] [place] [phone]           Newborn Data: Live born female  Birth Weight: 9 lb 2.6 oz (4156 g) APGAR: 9, 9  Newborn Delivery   Birth date/time:  06/02/2018 05:06:00 Delivery type:  Vaginal, Spontaneous     Home with mother.  Waynard ReedsKendra Delonte Musich 06/04/2018, 9:25 AM

## 2018-06-04 NOTE — Progress Notes (Signed)
Mom reports that she had an episode of incontinence last night. She said she knew she had do go to the bathroom, but couldn't make it in time. RN bladder scanned her for 200 ml, firm and midline. Mom reports going to the bathroom about an hour ago. RN encouraged mom to go about every two hours. Mom said this only happens occasionally and will notify RN if it happens again. Will continue to monitor. Royston CowperIsley, Daivd Fredericksen E, RN

## 2018-06-05 ENCOUNTER — Ambulatory Visit: Payer: Self-pay

## 2018-06-05 NOTE — Lactation Note (Signed)
This note was copied from a baby's chart. Lactation Consultation Note  Patient Name: Wendy Shaffer UJWJX'BToday's Date: 06/05/2018    Visited with P2 Mom of term baby at 2377 hrs old.  Baby at 7% weight loss.  Phototherapy discontinued and rebound bili to be drawn later.  Mom denies any difficulty latching baby.  Hand pump given and Mom started using it.  Milk flowing easily.    Mom states baby is breastfeeding well.  No engorgement. Latch scores 9-10.  Output great.  Encouraged STS and cue based feeding, goal of 8-12 feedings per 24 hrs.  Engorgement prevention and treatment discussed.  Mom aware of OP lactation services available.    Encouraged Mom to call prn.    Judee ClaraSmith, Romulus Hanrahan E 06/05/2018, 10:34 AM

## 2021-11-03 ENCOUNTER — Other Ambulatory Visit: Payer: Self-pay | Admitting: Nurse Practitioner

## 2021-11-03 DIAGNOSIS — R011 Cardiac murmur, unspecified: Secondary | ICD-10-CM

## 2021-11-16 ENCOUNTER — Ambulatory Visit: Payer: Medicaid Other

## 2021-11-16 ENCOUNTER — Other Ambulatory Visit: Payer: Self-pay

## 2021-11-16 DIAGNOSIS — R011 Cardiac murmur, unspecified: Secondary | ICD-10-CM

## 2022-03-27 ENCOUNTER — Emergency Department (HOSPITAL_BASED_OUTPATIENT_CLINIC_OR_DEPARTMENT_OTHER)
Admission: EM | Admit: 2022-03-27 | Discharge: 2022-03-27 | Disposition: A | Discharge status: Home / Self Care | Payer: Medicaid Other | Attending: Emergency Medicine | Admitting: Emergency Medicine

## 2022-03-27 ENCOUNTER — Other Ambulatory Visit: Payer: Self-pay

## 2022-03-27 ENCOUNTER — Encounter (HOSPITAL_BASED_OUTPATIENT_CLINIC_OR_DEPARTMENT_OTHER): Payer: Self-pay | Admitting: Pediatrics

## 2022-03-27 ENCOUNTER — Emergency Department (HOSPITAL_BASED_OUTPATIENT_CLINIC_OR_DEPARTMENT_OTHER): Payer: Medicaid Other

## 2022-03-27 DIAGNOSIS — S8002XA Contusion of left knee, initial encounter: Secondary | ICD-10-CM | POA: Diagnosis not present

## 2022-03-27 DIAGNOSIS — W19XXXA Unspecified fall, initial encounter: Secondary | ICD-10-CM | POA: Diagnosis not present

## 2022-03-27 DIAGNOSIS — S8992XA Unspecified injury of left lower leg, initial encounter: Secondary | ICD-10-CM | POA: Diagnosis present

## 2022-03-27 MED ORDER — IBUPROFEN 400 MG PO TABS: 400.0000 mg | ORAL_TABLET | Freq: Four times a day (QID) | ORAL | 0 refills | Status: AC | PRN

## 2022-03-27 MED ORDER — NAPROXEN 250 MG PO TABS
500.0000 mg | ORAL_TABLET | Freq: Once | ORAL | Status: AC
  Administered 2022-03-27: 500 mg via ORAL
  Filled 2022-03-27: qty 2

## 2022-03-27 NOTE — ED Provider Notes (Signed)
?[facility] EMERGENCY DEPARTMENT ?Provider Note ? ? ?CSN: [id number] ?Arrival date & time: 03/27/22  0957 ? ?  ? ?History ? ?Chief Complaint  ?Patient presents with  ? Fall  ? ? ?Wendy Shaffer is a 41 y.o. female. ? ?HPI ? ?  ?41 year old female comes in with chief complaint of fall. ? ?Fall occurred at a store.  Patient fell onto her knee, twisted her leg in the process and now is having pain over her left knee with some bruising.  She denies any head trauma, neck trauma, chest pain, abdomen, shortness of breath or hip pain.  Patient is able to ambulate. ? ? ?Home Medications ?Prior to Admission medications   ?Medication Sig Start Date End Date Taking? Authorizing Provider  ?ibuprofen (ADVIL) 400 MG tablet Take 1 tablet (400 mg total) by mouth every 6 (six) hours as needed for moderate pain. 03/27/22  Yes Derwood Kaplan, MD  ?docusate sodium (COLACE) 100 MG capsule Take 1 capsule (100 mg total) by mouth 2 (two) times daily. 06/04/18   Waynard Reeds, MD  ?oxyCODONE-acetaminophen (PERCOCET/ROXICET) 5-325 MG tablet Take 1-2 tablets by mouth every 6 (six) hours as needed for severe pain. 06/04/18   Waynard Reeds, MD  ?   ? ?Allergies    ?Patient has no known allergies.   ? ?Review of Systems   ?Review of Systems ? ?Physical Exam ?Updated Vital Signs ?BP 108/70 (BP Location: Right Arm)   Pulse 71   Temp 98.4 ?F (36.9 ?C) (Oral)   Resp 18   Ht 5\' 5"  (1.651 m)   Wt 58.1 kg   LMP 03/18/2022   SpO2 100%   BMI 21.30 kg/m?  ?Physical Exam ?Vitals and nursing note reviewed.  ?Constitutional:   ?   Appearance: She is well-developed.  ?HENT:  ?   Head: Atraumatic.  ?Neck:  ?   Comments: No midline c-spine tenderness, pt able to turn head to 45 degrees bilaterally without any pain and able to flex neck to the chest and extend without any pain or neurologic symptoms. ? ?Cardiovascular:  ?   Rate and Rhythm: Normal rate.  ?Pulmonary:  ?   Effort: Pulmonary effort is normal.  ?Musculoskeletal:     ?   General: Swelling and  tenderness present.  ?   Cervical back: Normal range of motion and neck supple.  ?   Comments: Left knee erythema, edema and ecchymoses  ?Skin: ?   General: Skin is warm and dry.  ?Neurological:  ?   Mental Status: She is alert and oriented to person, place, and time.  ? ? ?ED Results / Procedures / Treatments   ?Labs ?(all labs ordered are listed, but only abnormal results are displayed) ?Labs Reviewed - No data to display ? ?EKG ?None ? ?Radiology ?DG Knee Complete 4 Views Left ? ?Result Date: 03/27/2022 ?CLINICAL DATA:  LEFT knee pain post fall EXAM: LEFT KNEE - COMPLETE 4+ VIEW COMPARISON:  None FINDINGS: Osseous mineralization normal. Joint spaces preserved. No fracture, dislocation, or bone destruction. No joint effusion. IMPRESSION: No acute abnormalities. Electronically Signed   By: 03/29/2022 M.D.   On: 03/27/2022 10:43   ? ?Procedures ?Procedures  ? ? ?Medications Ordered in ED ?Medications  ?naproxen (NAPROSYN) tablet 500 mg (500 mg Oral Given 03/27/22 1056)  ? ? ?ED Course/ Medical Decision Making/ A&P ?  ?                        ?  Medical Decision Making ?Amount and/or Complexity of Data Reviewed ?Radiology: ordered. ? ?Risk ?Prescription drug management. ? ? ?41 year old patient comes in with chief complaint of fall. ? ?Based on [place] CT head, C-spine rule, patient does not require CT imaging of head or C-spine. ? ?Patient does not have any chest pain, shortness of breath, her exam is reassuring, she does not need chest x-ray. ? ?Patient does have contusion over the knee, x-ray ordered and visualized independently.  No evidence of fracture. ? ?RICE treatment recommended. ?Patient does not want knee brace or crutches at this time. ? ?Final Clinical Impression(s) / ED Diagnoses ?Final diagnoses:  ?Contusion of left knee, initial encounter  ? ? ?Rx / DC Orders ?ED Discharge Orders   ? ?      Ordered  ?  ibuprofen (ADVIL) 400 MG tablet  Every 6 hours PRN       ? 03/27/22 1110  ? ?  ?  ? ?  ? ? ?   ?Derwood Kaplan, MD ?03/27/22 1115 ? ?

## 2022-03-27 NOTE — ED Triage Notes (Signed)
C/o left knee pain s/p mechanical fall at a grocery store.  ?

## 2022-03-27 NOTE — Discharge Instructions (Addendum)
X-ray does not reveal any fractures.   ? ?Ice the area at least 4 times a day for 10 to 15 minutes.  Also take ibuprofen every 6 hours. ?Consider using a knee sleeve if you are having pain with mobility. ? ?If the pain continues more than 7 days, please call the number provided to set up an appointment for repeat evaluations.  ?

## 2022-12-17 IMAGING — CR DG KNEE COMPLETE 4+V*L*
4 series · 4 of 4 positions shown · non-contrast
Comparison: None

CLINICAL DATA: LEFT knee pain post fall

EXAM:
LEFT KNEE - COMPLETE 4+ VIEW

[t knee ap left]
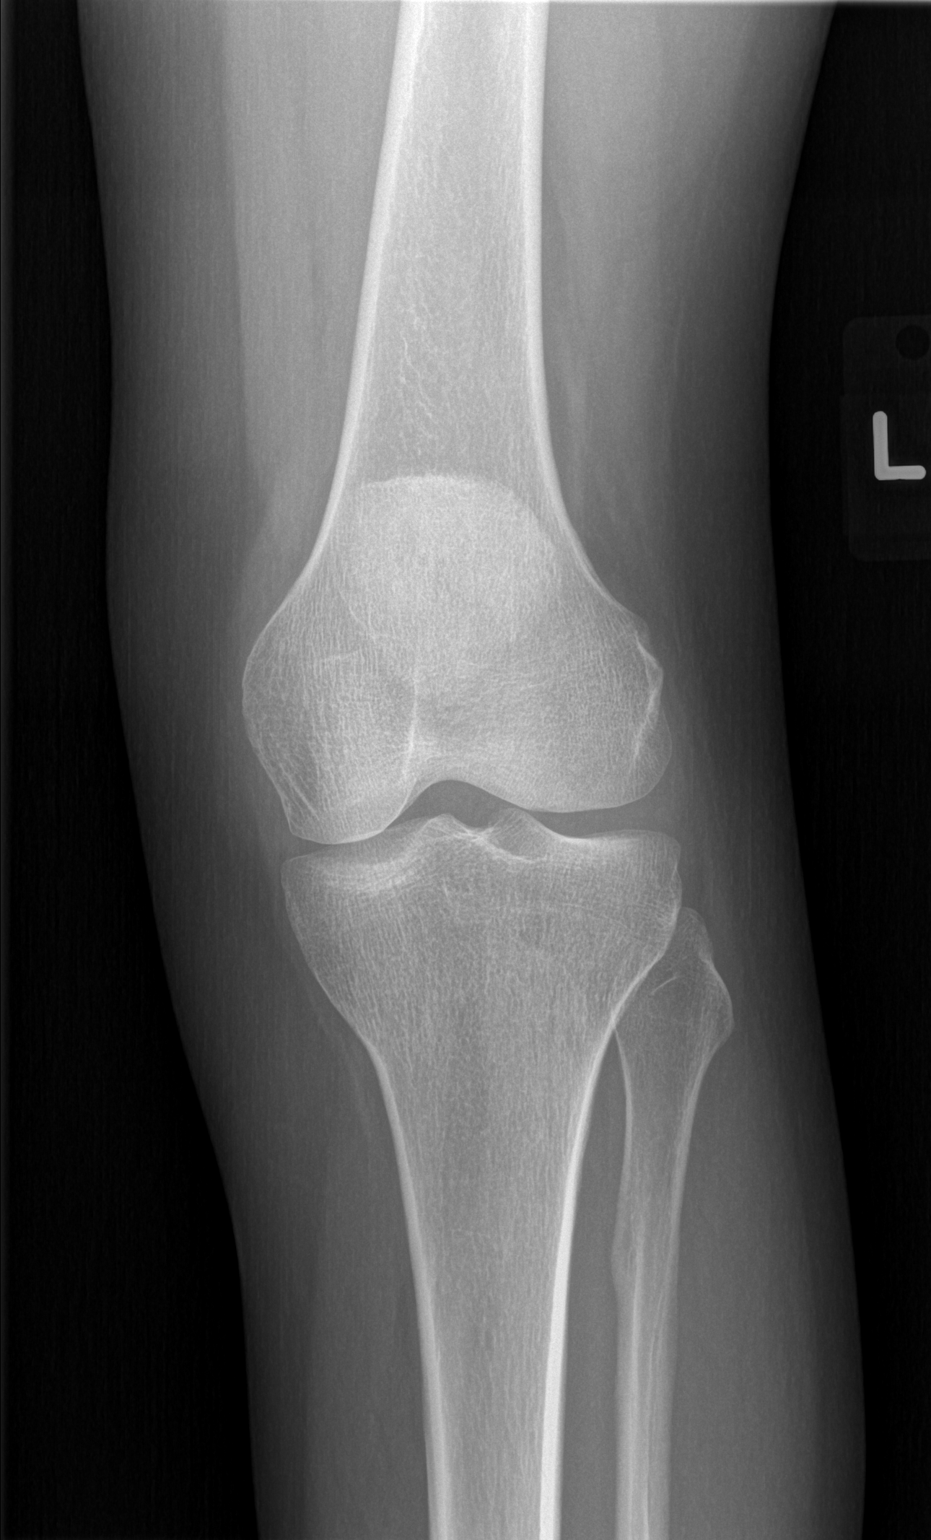

[t knee oblique left (1 of 2)]
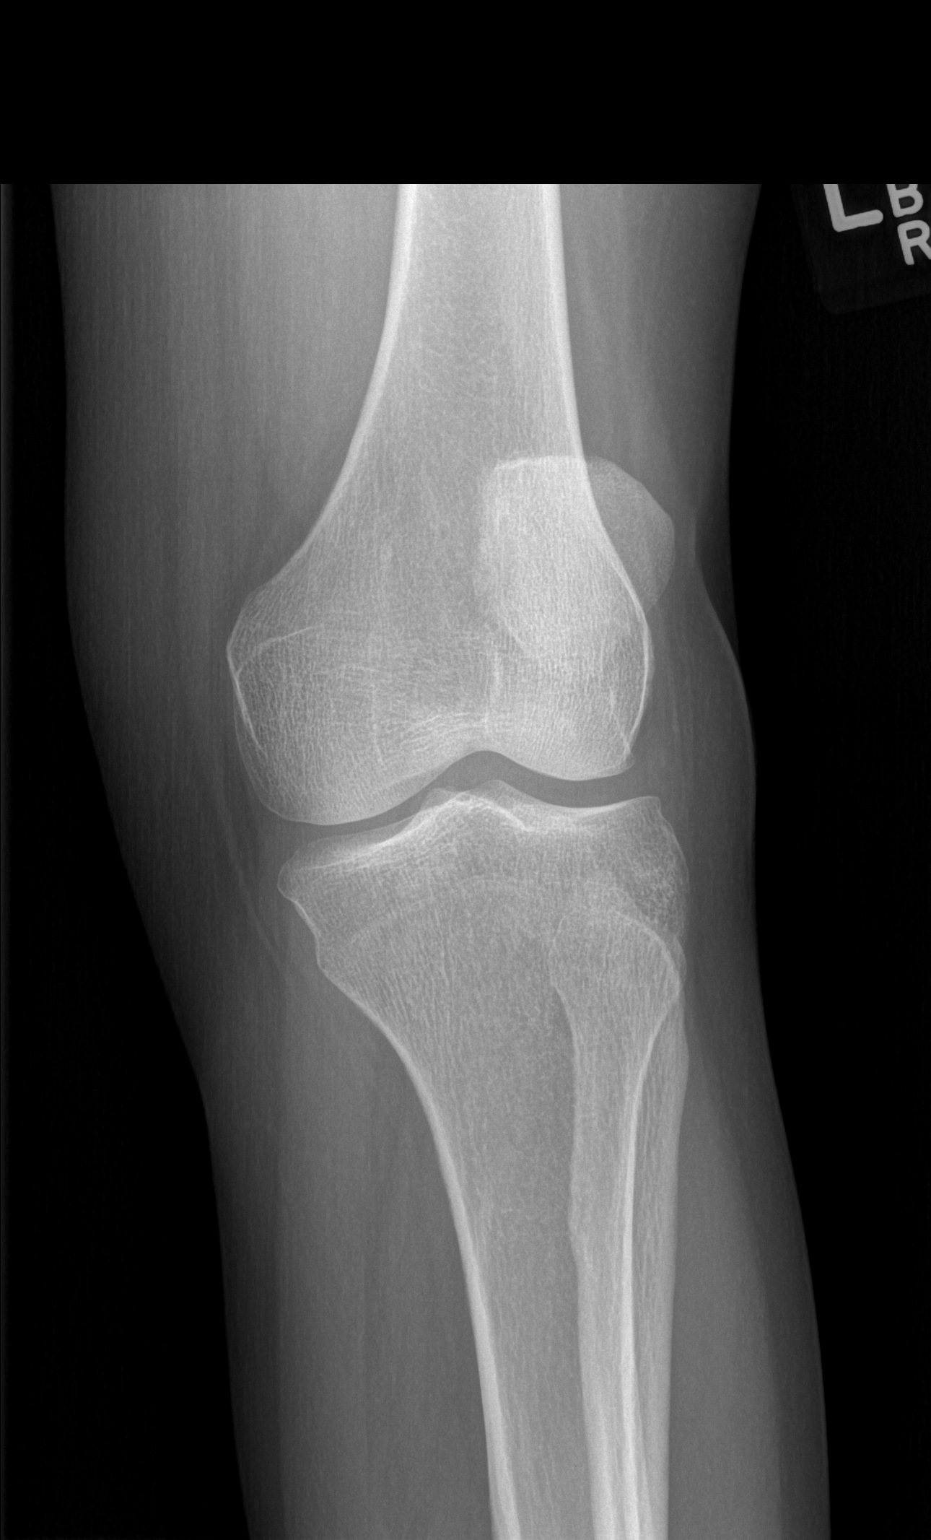

[t knee oblique left (2 of 2)]
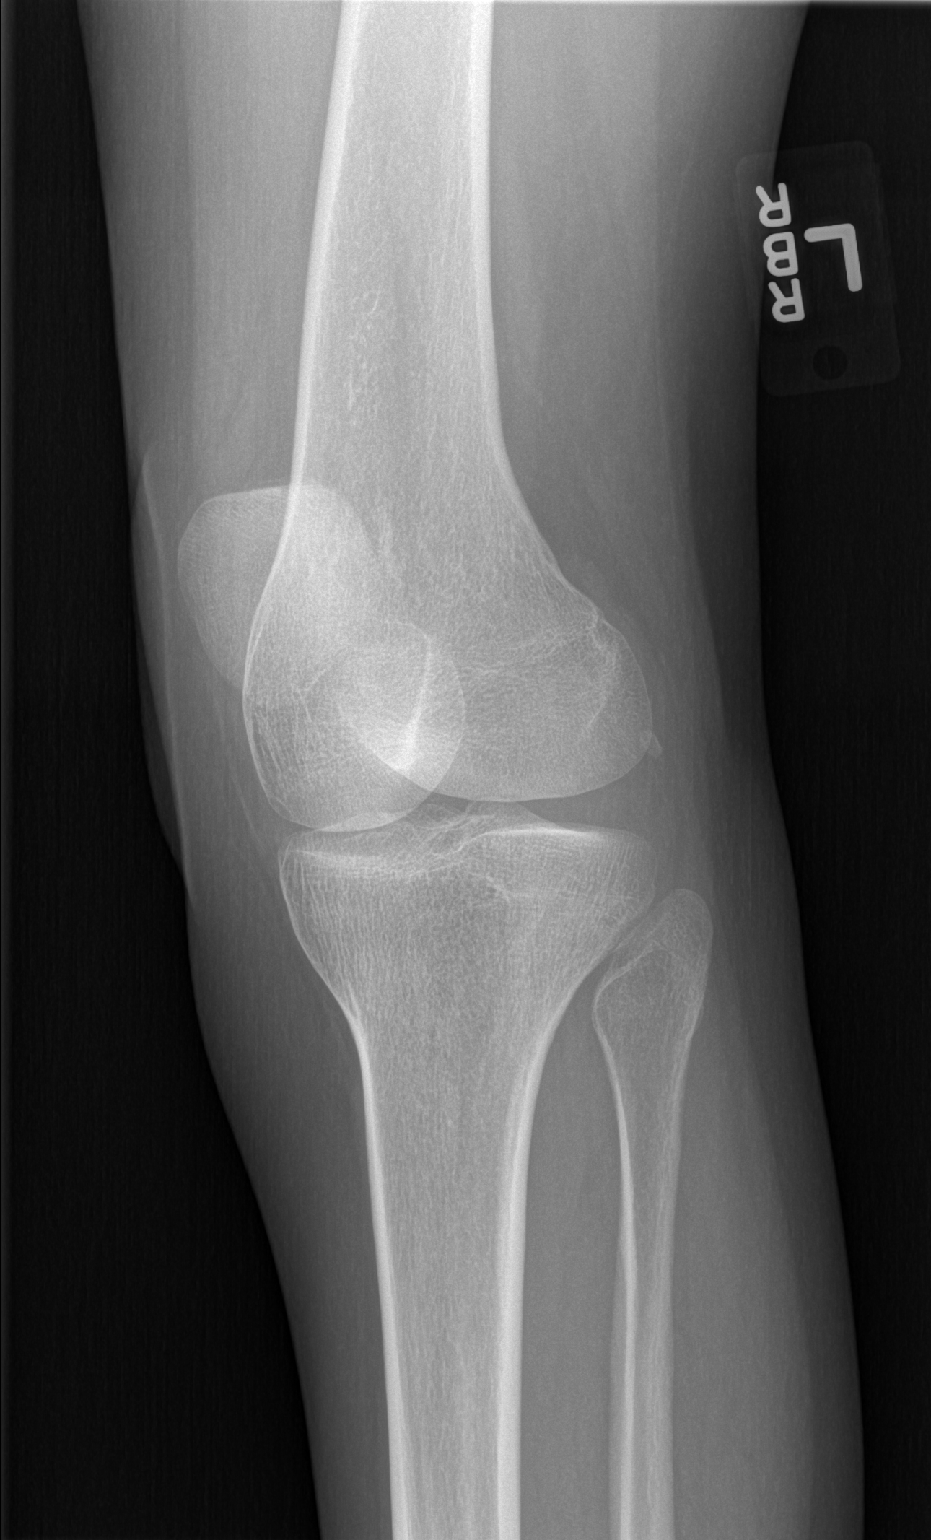

[t knee lat left]
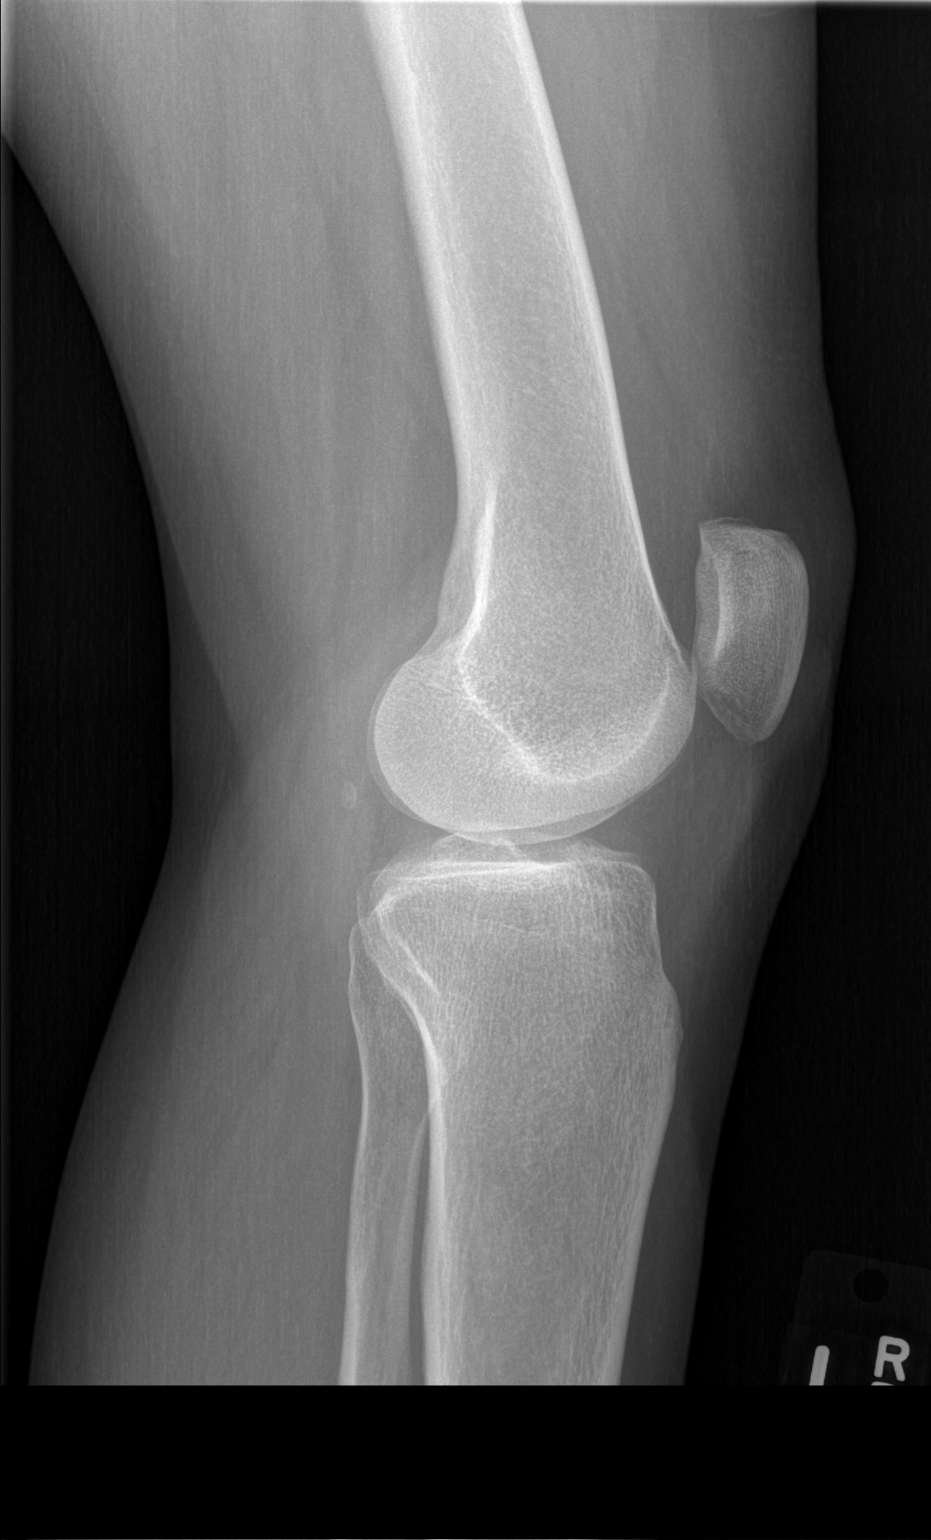

[4 of 4 positions shown; findings below may reference images not displayed]

FINDINGS: Osseous mineralization normal.

Joint spaces preserved.

No fracture, dislocation, or bone destruction.

No joint effusion.
IMPRESSION: No acute abnormalities.
